# Patient Record
Sex: Female | Born: 1995 | Race: White | Hispanic: No | Marital: Single | Smoking: Never smoker
Health system: Southern US, Community
[De-identification: ages and names within clinical notes are randomized; demographics above are authoritative.]

---
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
## 2015-06-14 DIAGNOSIS — J0100 Acute maxillary sinusitis, unspecified: Secondary | ICD-10-CM | POA: Diagnosis not present

## 2015-06-24 DIAGNOSIS — J019 Acute sinusitis, unspecified: Secondary | ICD-10-CM | POA: Diagnosis not present

## 2015-06-24 DIAGNOSIS — J209 Acute bronchitis, unspecified: Secondary | ICD-10-CM | POA: Diagnosis not present

## 2015-08-02 DIAGNOSIS — J069 Acute upper respiratory infection, unspecified: Secondary | ICD-10-CM | POA: Diagnosis not present

## 2015-08-02 DIAGNOSIS — J209 Acute bronchitis, unspecified: Secondary | ICD-10-CM | POA: Diagnosis not present

## 2015-08-06 DIAGNOSIS — J0100 Acute maxillary sinusitis, unspecified: Secondary | ICD-10-CM | POA: Diagnosis not present

## 2015-08-06 DIAGNOSIS — J209 Acute bronchitis, unspecified: Secondary | ICD-10-CM | POA: Diagnosis not present

## 2015-08-09 DIAGNOSIS — J209 Acute bronchitis, unspecified: Secondary | ICD-10-CM | POA: Diagnosis not present

## 2015-08-09 DIAGNOSIS — J0100 Acute maxillary sinusitis, unspecified: Secondary | ICD-10-CM | POA: Diagnosis not present

## 2015-10-12 DIAGNOSIS — Z01419 Encounter for gynecological examination (general) (routine) without abnormal findings: Secondary | ICD-10-CM | POA: Diagnosis not present

## 2016-02-06 DIAGNOSIS — Z1389 Encounter for screening for other disorder: Secondary | ICD-10-CM | POA: Diagnosis not present

## 2016-02-06 DIAGNOSIS — J329 Chronic sinusitis, unspecified: Secondary | ICD-10-CM | POA: Diagnosis not present

## 2016-05-17 DIAGNOSIS — N3090 Cystitis, unspecified without hematuria: Secondary | ICD-10-CM | POA: Diagnosis not present

## 2016-05-17 DIAGNOSIS — N3001 Acute cystitis with hematuria: Secondary | ICD-10-CM | POA: Diagnosis not present

## 2016-06-07 DIAGNOSIS — J029 Acute pharyngitis, unspecified: Secondary | ICD-10-CM | POA: Diagnosis not present

## 2016-06-18 DIAGNOSIS — J209 Acute bronchitis, unspecified: Secondary | ICD-10-CM | POA: Diagnosis not present

## 2016-06-18 DIAGNOSIS — Z6838 Body mass index (BMI) 38.0-38.9, adult: Secondary | ICD-10-CM | POA: Diagnosis not present

## 2016-08-13 ENCOUNTER — Emergency Department (HOSPITAL_COMMUNITY): Payer: BLUE CROSS/BLUE SHIELD

## 2016-08-13 ENCOUNTER — Encounter (HOSPITAL_COMMUNITY): Payer: Self-pay

## 2016-08-13 ENCOUNTER — Emergency Department (HOSPITAL_COMMUNITY)
Admission: EM | Admit: 2016-08-13 | Discharge: 2016-08-14 | Disposition: A | Discharge status: Home / Self Care | Payer: BLUE CROSS/BLUE SHIELD | Attending: Emergency Medicine | Admitting: Emergency Medicine

## 2016-08-13 DIAGNOSIS — R0602 Shortness of breath: Secondary | ICD-10-CM | POA: Diagnosis not present

## 2016-08-13 DIAGNOSIS — R0789 Other chest pain: Secondary | ICD-10-CM

## 2016-08-13 DIAGNOSIS — R079 Chest pain, unspecified: Secondary | ICD-10-CM | POA: Diagnosis not present

## 2016-08-13 LAB — BASIC METABOLIC PANEL
Anion gap: 10 (ref 5–15)
BUN: 7 mg/dL (ref 6–20)
CHLORIDE: 106 mmol/L (ref 101–111)
CO2: 22 mmol/L (ref 22–32)
CREATININE: 0.8 mg/dL (ref 0.44–1.00)
Calcium: 9 mg/dL (ref 8.9–10.3)
GFR calc Af Amer: >60 mL/min (ref >60)
GFR calc non Af Amer: >60 mL/min (ref >60)
GLUCOSE: 106 mg/dL — AB (ref 65–99)
Potassium: 3.4 mmol/L — ABNORMAL LOW (ref 3.5–5.1)
Sodium: 138 mmol/L (ref 135–145)

## 2016-08-13 LAB — I-STAT TROPONIN, ED: Troponin i, poc: 0 ng/mL (ref 0.00–0.08)

## 2016-08-13 LAB — CBC
HEMATOCRIT: 41.1 % (ref 36.0–46.0)
Hemoglobin: 13.4 g/dL (ref 12.0–15.0)
MCH: 29.4 pg (ref 26.0–34.0)
MCHC: 32.6 g/dL (ref 30.0–36.0)
MCV: 90.1 fL (ref 78.0–100.0)
PLATELETS: 405 10*3/uL — AB (ref 150–400)
RBC: 4.56 MIL/uL (ref 3.87–5.11)
RDW: 13.4 % (ref 11.5–15.5)
WBC: 12.9 10*3/uL — ABNORMAL HIGH (ref 4.0–10.5)

## 2016-08-13 NOTE — ED Triage Notes (Signed)
Pt reports central chest pain described as dull associated with SOB and pain with inspiration. She went to UC today and was sent here to rule out PE.

## 2016-08-14 ENCOUNTER — Other Ambulatory Visit: Payer: Self-pay

## 2016-08-14 LAB — D-DIMER, QUANTITATIVE (NOT AT ARMC): D DIMER QUANT: 0.3 ug{FEU}/mL (ref 0.00–0.50)

## 2016-08-14 NOTE — ED Notes (Signed)
Provider at bedside

## 2016-08-14 NOTE — ED Notes (Signed)
Pt stable, ambulatory, states understanding of discharge instructions 
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
## 2016-08-14 NOTE — Discharge Instructions (Signed)
I recommend taking 600 mg ibuprofen every 6 hours as needed for pain relief. I also recommend doing some of the stress relieving techniques we discussed today to help with her anxiety. Follow-up with your primary care provider if your symptoms have not improved over the next week.  Please return to the Emergency Department if symptoms worsen or new onset of fever, new/worsening chest pain, difficult breathing, coughing up blood, abdominal pain, vomiting, numbness, weakness, syncope.

## 2016-08-19 DIAGNOSIS — Z6837 Body mass index (BMI) 37.0-37.9, adult: Secondary | ICD-10-CM | POA: Diagnosis not present

## 2016-08-19 DIAGNOSIS — F419 Anxiety disorder, unspecified: Secondary | ICD-10-CM | POA: Diagnosis not present

## 2016-11-18 DIAGNOSIS — Z23 Encounter for immunization: Secondary | ICD-10-CM | POA: Diagnosis not present

## 2016-11-18 DIAGNOSIS — Z6837 Body mass index (BMI) 37.0-37.9, adult: Secondary | ICD-10-CM | POA: Diagnosis not present

## 2016-11-18 DIAGNOSIS — F419 Anxiety disorder, unspecified: Secondary | ICD-10-CM | POA: Diagnosis not present

## 2016-11-18 DIAGNOSIS — Z1339 Encounter for screening examination for other mental health and behavioral disorders: Secondary | ICD-10-CM | POA: Diagnosis not present

## 2016-12-09 DIAGNOSIS — H40013 Open angle with borderline findings, low risk, bilateral: Secondary | ICD-10-CM | POA: Diagnosis not present

## 2017-01-27 DIAGNOSIS — Z01419 Encounter for gynecological examination (general) (routine) without abnormal findings: Secondary | ICD-10-CM | POA: Diagnosis not present

## 2017-03-10 DIAGNOSIS — J069 Acute upper respiratory infection, unspecified: Secondary | ICD-10-CM | POA: Diagnosis not present

## 2017-03-10 DIAGNOSIS — J039 Acute tonsillitis, unspecified: Secondary | ICD-10-CM | POA: Diagnosis not present

## 2017-03-13 DIAGNOSIS — J101 Influenza due to other identified influenza virus with other respiratory manifestations: Secondary | ICD-10-CM | POA: Diagnosis not present

## 2017-03-13 DIAGNOSIS — R110 Nausea: Secondary | ICD-10-CM | POA: Diagnosis not present

## 2017-06-18 DIAGNOSIS — Z309 Encounter for contraceptive management, unspecified: Secondary | ICD-10-CM | POA: Diagnosis not present

## 2017-06-18 DIAGNOSIS — F419 Anxiety disorder, unspecified: Secondary | ICD-10-CM | POA: Diagnosis not present

## 2017-08-08 DIAGNOSIS — F329 Major depressive disorder, single episode, unspecified: Secondary | ICD-10-CM | POA: Diagnosis not present

## 2017-08-18 DIAGNOSIS — R309 Painful micturition, unspecified: Secondary | ICD-10-CM | POA: Diagnosis not present

## 2017-08-18 DIAGNOSIS — M545 Low back pain: Secondary | ICD-10-CM | POA: Diagnosis not present

## 2017-08-20 DIAGNOSIS — J029 Acute pharyngitis, unspecified: Secondary | ICD-10-CM | POA: Diagnosis not present

## 2018-09-28 IMAGING — DX DG CHEST 2V
2 series · 2 of 2 positions shown · non-contrast
Comparison: None.

CLINICAL DATA: chest pain and shortness of breath.

EXAM:
CHEST  2 VIEW

[w chest pa]
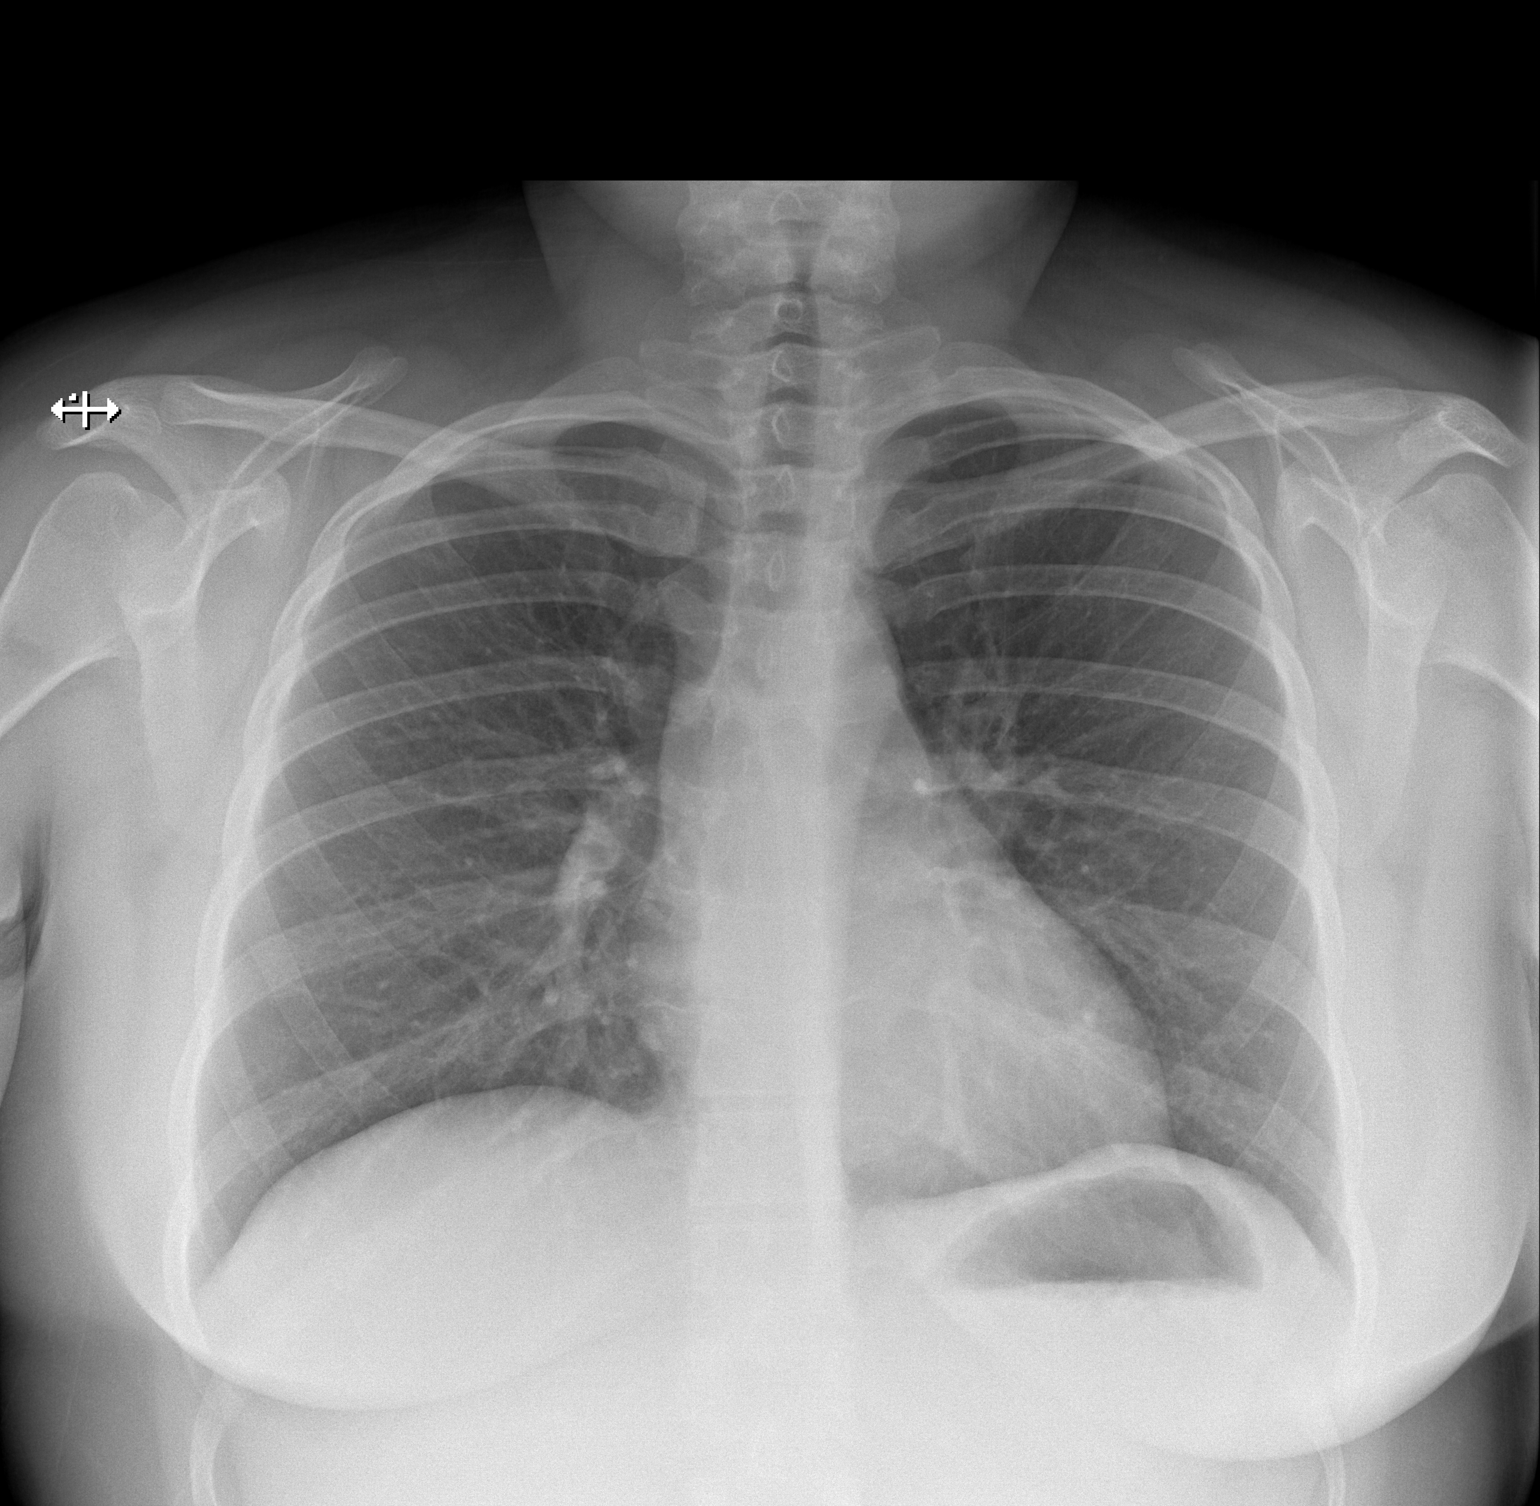

[w chest lat]
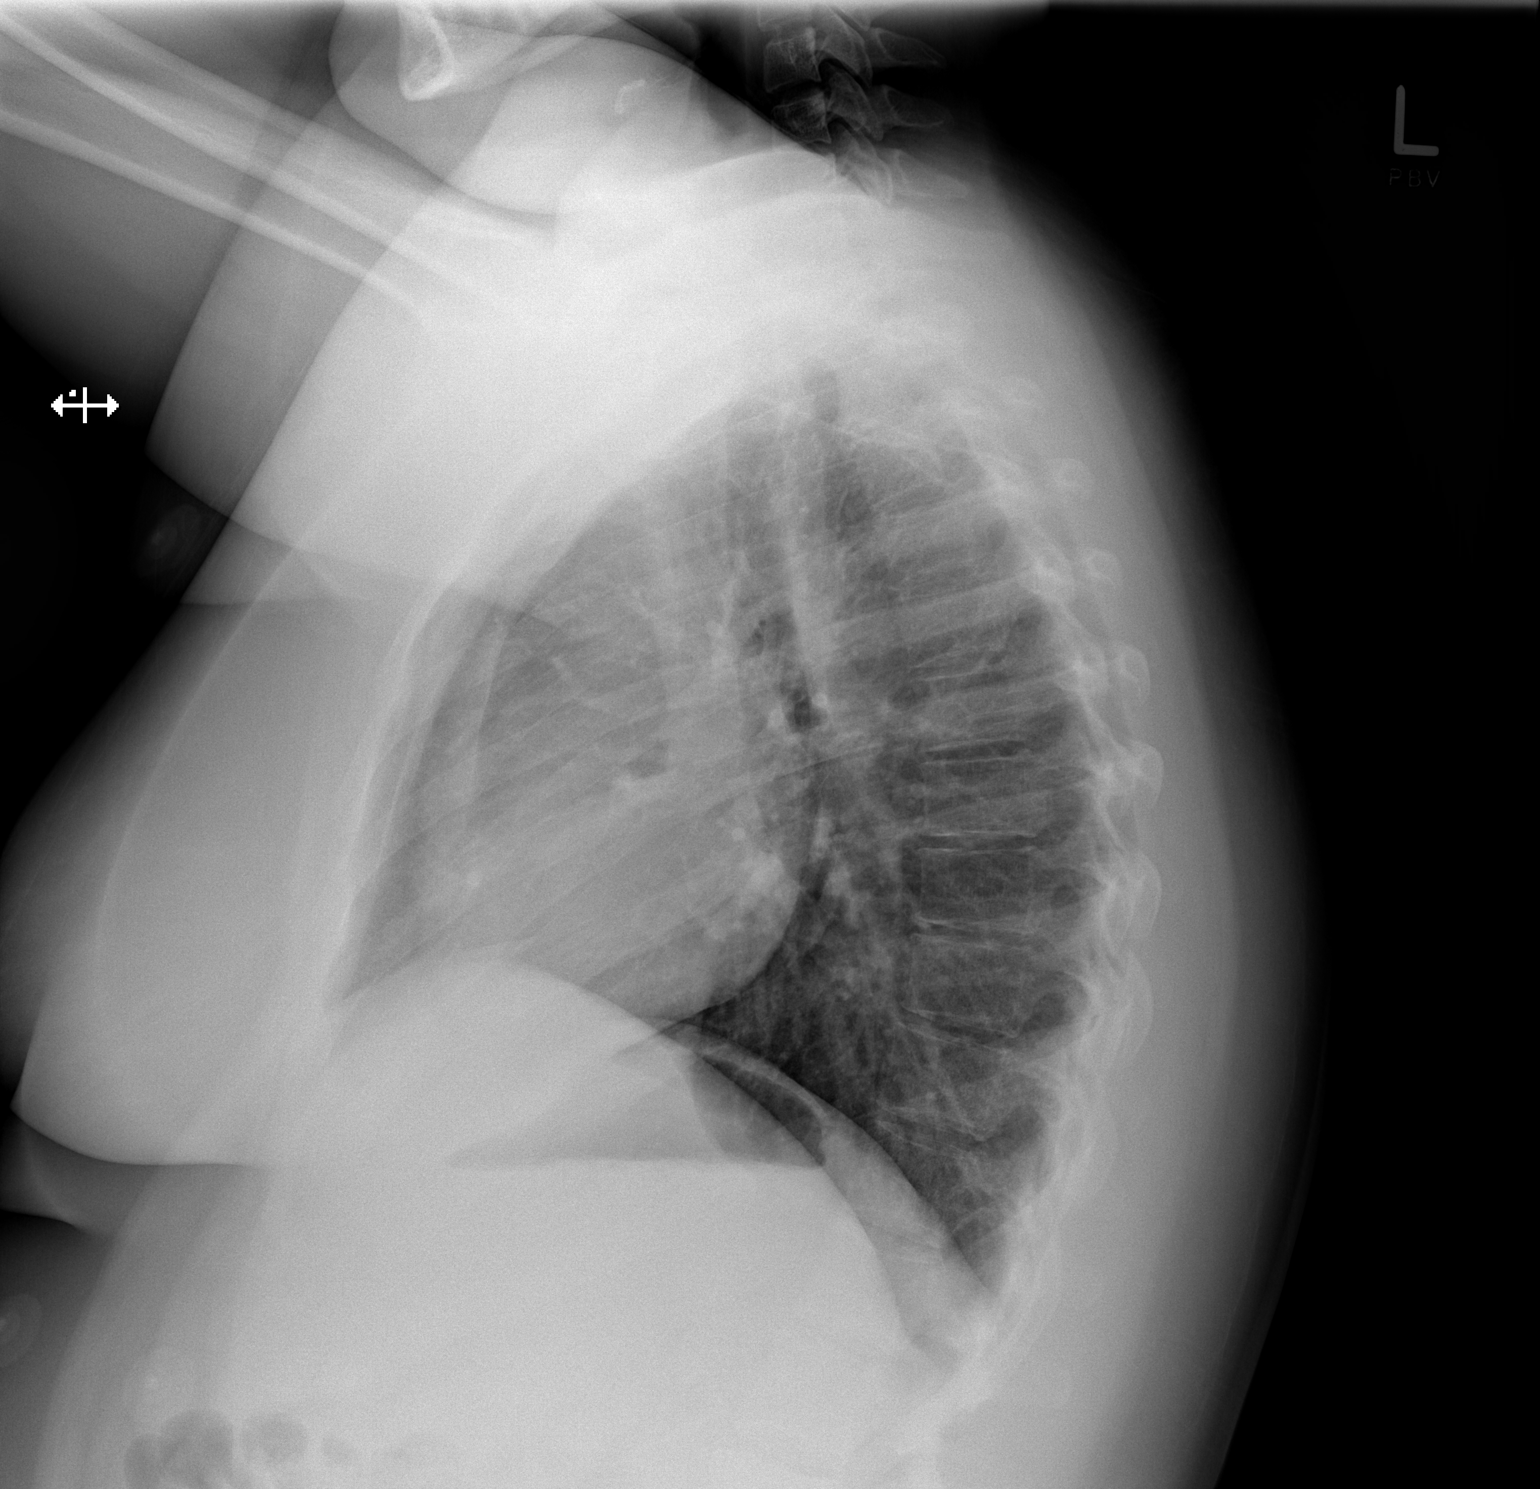

[2 of 2 positions shown; findings below may reference images not displayed]

FINDINGS: The heart size and mediastinal contours are within normal limits.
Both lungs are clear. The visualized skeletal structures are
unremarkable.
IMPRESSION: No active cardiopulmonary disease.

## 2018-09-29 DIAGNOSIS — R319 Hematuria, unspecified: Secondary | ICD-10-CM | POA: Diagnosis not present

## 2018-09-29 DIAGNOSIS — R109 Unspecified abdominal pain: Secondary | ICD-10-CM | POA: Diagnosis not present

## 2018-10-01 DIAGNOSIS — Z309 Encounter for contraceptive management, unspecified: Secondary | ICD-10-CM | POA: Diagnosis not present

## 2018-10-01 DIAGNOSIS — R109 Unspecified abdominal pain: Secondary | ICD-10-CM | POA: Diagnosis not present
